# Patient Record
Sex: Female | Born: 1965 | Hispanic: No | Marital: Married | Smoking: Never smoker
Health system: Southern US, Community
[De-identification: ages and names within clinical notes are randomized; demographics above are authoritative.]

---

## 2002-08-20 ENCOUNTER — Ambulatory Visit (HOSPITAL_COMMUNITY): Admission: RE | Admit: 2002-08-20 | Discharge: 2002-08-20 | Payer: Self-pay | Admitting: Obstetrics and Gynecology

## 2002-08-20 ENCOUNTER — Encounter (INDEPENDENT_AMBULATORY_CARE_PROVIDER_SITE_OTHER): Payer: Self-pay | Admitting: Specialist

## 2003-06-18 ENCOUNTER — Inpatient Hospital Stay (HOSPITAL_COMMUNITY): Admission: AD | Admit: 2003-06-18 | Discharge: 2003-06-18 | Payer: Self-pay | Admitting: Obstetrics & Gynecology

## 2003-07-26 ENCOUNTER — Ambulatory Visit (HOSPITAL_COMMUNITY): Admission: RE | Admit: 2003-07-26 | Discharge: 2003-07-26 | Payer: Self-pay | Admitting: Family Medicine

## 2003-09-08 ENCOUNTER — Ambulatory Visit (HOSPITAL_COMMUNITY): Admission: RE | Admit: 2003-09-08 | Discharge: 2003-09-08 | Payer: Self-pay | Admitting: Obstetrics and Gynecology

## 2003-12-29 ENCOUNTER — Ambulatory Visit (HOSPITAL_COMMUNITY): Admission: RE | Admit: 2003-12-29 | Discharge: 2003-12-29 | Payer: Self-pay | Admitting: Family Medicine

## 2004-03-29 ENCOUNTER — Ambulatory Visit (HOSPITAL_COMMUNITY): Admission: RE | Admit: 2004-03-29 | Discharge: 2004-03-29 | Payer: Self-pay | Admitting: Family Medicine

## 2004-09-13 ENCOUNTER — Ambulatory Visit (HOSPITAL_COMMUNITY): Admission: RE | Admit: 2004-09-13 | Discharge: 2004-09-13 | Payer: Self-pay | Admitting: Obstetrics and Gynecology

## 2004-11-20 ENCOUNTER — Encounter: Admission: RE | Admit: 2004-11-20 | Discharge: 2005-02-18 | Payer: Self-pay | Admitting: Obstetrics and Gynecology

## 2005-01-18 ENCOUNTER — Inpatient Hospital Stay (HOSPITAL_COMMUNITY): Admission: AD | Admit: 2005-01-18 | Discharge: 2005-01-18 | Payer: Self-pay | Admitting: Obstetrics and Gynecology

## 2005-02-20 ENCOUNTER — Inpatient Hospital Stay (HOSPITAL_COMMUNITY): Admission: AD | Admit: 2005-02-20 | Discharge: 2005-02-22 | Payer: Self-pay | Admitting: Obstetrics and Gynecology

## 2005-02-23 ENCOUNTER — Encounter: Admission: RE | Admit: 2005-02-23 | Discharge: 2005-03-12 | Payer: Self-pay | Admitting: Obstetrics and Gynecology

## 2008-09-05 ENCOUNTER — Ambulatory Visit (HOSPITAL_COMMUNITY): Admission: RE | Admit: 2008-09-05 | Discharge: 2008-09-05 | Payer: Self-pay | Admitting: Obstetrics

## 2009-08-30 ENCOUNTER — Encounter: Admission: RE | Admit: 2009-08-30 | Discharge: 2009-08-30 | Payer: Self-pay | Admitting: Family Medicine

## 2011-03-29 NOTE — Op Note (Signed)
Janet Hudson, Janet Hudson             ACCOUNT NO.:  [id number]   MEDICAL RECORD NO.:  [id number]          PATIENT TYPE:  INP   LOCATION:  9143                          FACILITY:  [facility]   PHYSICIAN:  Gerri Spore B. Earlene Plater, M.D.  DATE OF BIRTH:  Jan 28, 1966   DATE OF PROCEDURE:  02/20/2005  DATE OF DISCHARGE:                                 OPERATIVE REPORT   PREOPERATIVE DIAGNOSIS:  1.  Term intrauterine pregnancy.  2.  Gestational diabetes well controlled.  3.  Repetitive severe variable decelerations.   POSTOPERATIVE DIAGNOSIS:  1.  Term intrauterine pregnancy.  2.  Gestational diabetes well controlled.  3.  Repetitive severe variable decelerations.   PROCEDURE:  Vacuum-assisted vaginal delivery from complete, complete, +3, OP  position.   ANESTHESIA:  Epidural.   FINDINGS:  A viable female, Apgars 9 and 9, cord pH 7.18.   COMPLICATIONS:  Mild shoulder dystocia relieved with McRobert's position and  suprapubic pressure x1.   INDICATIONS FOR PROCEDURE:  The patient presented with spontaneous rupture  of membranes at 39+ weeks, history of gestational diabetes, with estimated  fetal weight in the 3600 to 3900 gram range.  The patient was augmented with  Pitocin and progressed well to complete.  While pushing, she developed  repetitive severe variable decelerations that did not resolve with position  change, oxygen, or fluid bolus.  She was found to be complete, complete, +3  station, and direct OP.  Options were discussed including a primary cesarean  section versus vacuum-assisted delivery.  Risks of the vacuum discussed  including fetal scalp trauma, cephalohematoma, intracranial bleeding, and  discussed increased risk of maternal laceration.  Discussed the risk of  cesarean section including infection, bleeding, and damage to surrounding  organs.  The patient chose to try the vacuum.   DESCRIPTION OF PROCEDURE:  The bladder was emptied with in-and-out  catheterization.  Repeat  examination under anesthesia again showed complete,  complete, +3, OP position.  The vacuum was placed on the flexion point and  with two pulls descent occurred, but it was obvious that the OP position was  limiting the ability to delivery rapidly.  Therefore, the vertex was  manually rotated to OA and one additional pull used to deliver the vertex.  Nose and mouth were suctioned with a bulb.  Nuchal cord x1 noted.  Mild  shoulder dystocia encountered and relieved with McRobert's position and  suprapubic pressure x1.  Cord was clamped and cut.  The infant handed off to  the awaiting NICU team.  The baby was immediately vigorous and showed no  signs of injury.   The placenta was expelled spontaneously.  The uterus was explored.  There  were no retained contents.   The vagina was inspected and there was a fourth degree laceration and left  vaginal sulcus tear.   The rectal mucosa was closed with a running stitch of 3-0 chromic.  The  perirectal fascia was reapproximated with a running stitch of 2-0 chromic.  The lacerated ends of the rectal sphincter were grasped with an Allis clamp  and reapproximated with interrupted figure-of-eight sutures of 0  Vicryl.  The sulcus tear was closed with a running locked stitch of 2-0 chromic.  The  remainder of the vaginal laceration was closed with a running locked stitch  of 2-0 chromic.  The perineum was closed with a subcuticular 3-0 chromic.  Rectal examination showed complete closure, no evidence of fistula, and no  active bleeding.   The patient tolerated the procedure well and she and the newborn were in the  delivery room, both in satisfactory condition.      WBD/MEDQ  D:  02/20/2005  T:  02/21/2005  Job:  [id number]

## 2011-03-29 NOTE — Op Note (Signed)
NAME:  Janet Hudson, Janet Hudson                       ACCOUNT NO.:  [id number]   MEDICAL RECORD NO.:  [id number]                   PATIENT TYPE:  AMB   LOCATION:  SDC                                  [facility]ACILITY:  [facility]   PHYSICIAN:  Sherry A. Rosalio Macadamia, M.D.           DATE OF BIRTH:  1965-11-23   DATE OF PROCEDURE:  08/20/2002  DATE OF DISCHARGE:                                 OPERATIVE REPORT   PREOPERATIVE DIAGNOSES:  Tight hymenal band.   POSTOPERATIVE DIAGNOSES:  Tight hymenal band.   PROCEDURE:  1. Examination under anesthesia.  2. Pap smear.  3. Hymenotomy.   SURGEON:  Sherry A. Rosalio Macadamia, M.D.   ANESTHESIA:  General.   INDICATIONS:  This is a 45 year old G0, P0 woman who has been unable to have  any sexual coital activity in the three years that she has been married.  The patient has too significant discomfort when this has been attempted and  patient requests surgical treatment.  The patient was seen in the office  revealing a tight hymenal band that would only allow one finger to be  inserted.  Because of this patient is brought to the operating room for  examination under anesthesia, Pap smear, and hymenotomy.   FINDINGS:  Tight hymenal band.  Uterus anteflexed, normal size, mobile.  Normal cervix.  No adnexal mass.   PROCEDURE:  The patient was brought into the operating room.  Given adequate  general anesthesia.  She was placed in a dorsal lithotomy position.  Small  speculum was placed in the vagina.  Pap smear was obtained.  The patient was  then washed with Hibiclens externally and gently internally into the vagina.  The patient was draped in a sterile fashion.  Using double clamps of either  double hemostats or double Kelly clamps, the hymenal band was clamped, cut,  and sutured with 3-0 chromic at 10 o'clock, 7 o'clock, 5 o'clock, and 2  o'clock.  This was done in such a fashion to remove the hymenal band tissue.  It also had to be done in several different depths  in those positions to be  able to get enough extension of the vaginal tissues.  Small amount of  bleeding was present.  The small bleeders were stitched with 3-0 chromic in  a figure-of-eight stitch.  The incision was infiltrated with 0.5% Marcaine  with epinephrine throughout the area to encourage postoperative pain relief.  There are small bleeding sites from the needle holes.  This was stopped with  Monsel solution and Estrace cream was placed around the entire vaginal  introitus.  Adequate hemostasis was then present.  The patient was taken out  of the dorsal lithotomy position.  She was awakened.  She was moved from the  operating table to a stretcher in stable condition.    COMPLICATIONS:  None.   ESTIMATED BLOOD LOSS:  Less than 5 cc.  Sherry A. Rosalio Macadamia, M.D.    SAD/MEDQ  D:  08/20/2002  T:  08/20/2002  Job:  [id number]

## 2011-03-29 NOTE — H&P (Signed)
Janet Hudson, Janet Hudson             ACCOUNT NO.:  [id number]   MEDICAL RECORD NO.:  [id number]          PATIENT TYPE:  INP   LOCATION:  9173                          [facility]ACILITY:  [facility]   PHYSICIAN:  Richardean Sale, M.D.   DATE OF BIRTH:  1965-11-13   DATE OF ADMISSION:  02/20/2005  DATE OF DISCHARGE:                                HISTORY & PHYSICAL   ADMISSION DIAGNOSES:  A 39 week pregnancy with spontaneous rupture of  membranes and onset of labor.   HISTORY OF PRESENT ILLNESS:  This is a 45 year old, gravida 1, para 77 [place]  female at [redacted] weeks gestation with a due date of February 27, 2005 by ultrasound  who presented complaining of spontaneous rupture of membranes at 1:30 in the  morning with clear fluid. The patient noticed the onset of contractions  after she arrived to the hospital. She denies any vaginal bleeding, reports  good fetal movement. Prenatal care has been at [facility] with Dr.  Richardean Sale as the primary attending. The pregnancy has been complicated  by gestational diabetes which has been well controlled with diet. Patient  without any other complaints on admission.   PAST MEDICAL HISTORY:  No prior hospitalizations.   PAST SURGICAL HISTORY:  Hymenectomy in 2003.   OBSTETRIC HISTORY:  Gravida 1, para 0.   GYNECOLOGIC HISTORY:  Unexplained infertility. No history of abnormal Pap  smears or STD's.   FAMILY HISTORY:  Positive for hypertension and non-insulin dependent  diabetes, negative for any birth defects, congenital anomalies, spina  bifida, Down syndrome.   SOCIAL HISTORY:  She is married, denies tobacco, alcohol or drugs. 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   MEDICATIONS:  Prenatal vitamins.   ALLERGIES:  DARVOCET causes shortness of breath.   PRENATAL LABS:  Blood type is O positive, antibody screen negative, RPR  nonreactive, rubella immune, hepatitis B surface antigen nonreactive, group  B beta strap negative. HIV was declined. Three  hour Glucola abnormal  consistent with gestational diabetes. Blood sugars have been fasting is less  than 95 and 2 hour post prandial's less than 120. She has not required  insulin. The patient underwent first trimester screening with Dr. Particia Nearing for advanced maternal age which was within normal limits. She had an  AFP that was normal and she declined amniocentesis.   PHYSICAL EXAMINATION:  VITAL SIGNS:  She is afebrile, her vital signs are  stable. Height is 5 foot 0, weight 155 pounds.  GENERAL:  She is a well-developed, well-nourished, [place] female who is in  no acute distress but appears slightly nervous.  HEART:  Regular rate and rhythm.  LUNGS:  Clear to auscultation bilaterally.  ABDOMEN:  Gravid, soft, nontender. Fundal height is appropriate.  EXTREMITIES:  No cyanosis, clubbing or edema.  PELVIC:  Cervix is 2 to 3 cm, 70% effaced, -2 station, vertex. Fetal heart  rate tracing is reactive, contractions occuring irregularly.   Ultrasound performed on the date of admission shows a viable intrauterine  pregnancy in the vertex presentation. Estimated fetal weight is 3651 g to  3952 g based on  ultrasound today. Per the ultrasonographer, given the head  position the measurements of the head may not be entirely accurate and the  estimated fetal weight may be off up to 20 ounces per the ultrasonographer.  Position is occiput posterior, amniotic fluid index is 6 cm consistent with  ruptured membranes, placenta is posterior and grade 3.   ASSESSMENT:  A 45 year old, gravida 1, para 0 [place] female at [redacted] weeks  gestation by first trimester ultrasound with well controlled gestational  diabetes class A1 with spontaneous rupture of membranes and onset of labor.   PLAN:  1.  Ultrasound findings reviewed with the patient and her husband. Given      that the estimated fetal weight is less than 4000 g would not recommend      primary cesarean section at this time but recommend close  monitoring of      progress and labor with intrauterine pressure catheter and close      monitoring of Friedman curve. I reviewed with the patient that with her      gestational diabetes even though it is well controlled, there is a      slightly increased risk of shoulder dystocia which could cause      neurologic damage to the fetus, fracture the clavicle and there is an      increased risk of need for episiotomy and perineal trauma. Reviewed with      patient that the ultrasound may be inaccurate and the fetal weight may      be off by up to 20 ounces per the ultrasonographer. The patient      understands the need for close monitoring of progress in labor.  2.  Check CBC and hold clot on admission.  3.  Continuous fetal monitoring.  4.  Patient may have Stadol or epidural as needed for pain.  5.  All questions answered. The patient has voiced an understanding of above      plan.      JW/MEDQ  D:  02/20/2005  T:  02/20/2005  Job:  [id number]

## 2012-02-04 ENCOUNTER — Other Ambulatory Visit (HOSPITAL_COMMUNITY): Payer: Self-pay | Admitting: Obstetrics

## 2012-02-04 DIAGNOSIS — Z1231 Encounter for screening mammogram for malignant neoplasm of breast: Secondary | ICD-10-CM

## 2012-02-26 ENCOUNTER — Ambulatory Visit (HOSPITAL_COMMUNITY)
Admission: RE | Admit: 2012-02-26 | Discharge: 2012-02-26 | Disposition: A | Discharge status: Home / Self Care | Payer: BC Managed Care – PPO | Source: Ambulatory Visit | Attending: Obstetrics | Admitting: Obstetrics

## 2012-02-26 DIAGNOSIS — Z1231 Encounter for screening mammogram for malignant neoplasm of breast: Secondary | ICD-10-CM | POA: Insufficient documentation

## 2013-10-12 ENCOUNTER — Emergency Department (HOSPITAL_COMMUNITY)
Admission: EM | Admit: 2013-10-12 | Discharge: 2013-10-12 | Disposition: A | Discharge status: Home / Self Care | Payer: BC Managed Care – PPO | Source: Home / Self Care

## 2013-10-12 ENCOUNTER — Encounter (HOSPITAL_COMMUNITY): Payer: Self-pay | Admitting: Emergency Medicine

## 2013-10-12 DIAGNOSIS — R82998 Other abnormal findings in urine: Secondary | ICD-10-CM

## 2013-10-12 DIAGNOSIS — R1031 Right lower quadrant pain: Secondary | ICD-10-CM

## 2013-10-12 DIAGNOSIS — R319 Hematuria, unspecified: Secondary | ICD-10-CM

## 2013-10-12 LAB — POCT URINALYSIS DIP (DEVICE)
Bilirubin Urine: NEGATIVE
Glucose, UA: NEGATIVE mg/dL
Ketones, ur: NEGATIVE mg/dL
Nitrite: NEGATIVE
Protein, ur: NEGATIVE mg/dL
Specific Gravity, Urine: 1.025 (ref 1.005–1.030)
Urobilinogen, UA: 0.2 mg/dL (ref 0.0–1.0)
pH: 6.5 (ref 5.0–8.0)

## 2013-10-12 MED ORDER — TRAMADOL HCL 50 MG PO TABS: 50.0000 mg | ORAL_TABLET | Freq: Four times a day (QID) | ORAL | Status: DC | PRN

## 2013-10-12 MED ORDER — CEPHALEXIN 500 MG PO CAPS: 500.0000 mg | ORAL_CAPSULE | Freq: Four times a day (QID) | ORAL | Status: DC

## 2013-10-12 NOTE — ED Provider Notes (Signed)
CSN: [id number]     Arrival date & time 10/12/13  0941 History   First MD Initiated Contact with Patient 10/12/13 1019     Chief Complaint  Patient presents with  . Flank Pain   (Consider location/radiation/quality/duration/timing/severity/associated sxs/prior Treatment) HPI Comments: 47 year old mildly obese female states that last night she developed right low back pain radiates along the right lateral abdomen and to the right lower quadrant. The pain waxes and wanes, intermittent and often abates completely. Pain to be mild to severe at times. The pain is not affected by eating or movement. On rare occasion taking a deep breath will produce mild and brief pain. Denies fever, gastrointestinal symptoms. Vaginal discharge or pelvic pain. No urinary symptoms.   History reviewed. No pertinent past medical history. History reviewed. No pertinent past surgical history. History reviewed. No pertinent family history. History  Substance Use Topics  . Smoking status: Never Smoker   . Smokeless tobacco: Not on file  . Alcohol Use: No   OB History   Grav Para Term Preterm Abortions TAB SAB Ect Mult Living                 Review of Systems  Constitutional: Negative.   HENT: Negative.   Respiratory: Negative.   Cardiovascular: Negative.   Gastrointestinal: Positive for abdominal pain. Negative for nausea, vomiting, constipation and blood in stool.  Genitourinary: Negative for dysuria, urgency, frequency, flank pain, vaginal bleeding, vaginal discharge, enuresis, vaginal pain, menstrual problem and pelvic pain.  Musculoskeletal: Negative.   Neurological: Negative.     Allergies  Review of patient's allergies indicates no known allergies.  Home Medications   Current Outpatient Rx  Name  Route  Sig  Dispense  Refill  . cephALEXin (KEFLEX) 500 MG capsule   Oral   Take 1 capsule (500 mg total) by mouth 4 (four) times daily.   28 capsule   0   . traMADol (ULTRAM) 50 MG tablet   Oral    Take 1 tablet (50 mg total) by mouth every 6 (six) hours as needed.   15 tablet   0    BP 111/65  Pulse 76  Temp(Src) 98.6 F (37 C) (Oral)  Resp 16  SpO2 100% Physical Exam  Nursing note and vitals reviewed. Constitutional: She is oriented to person, place, and time. She appears well-developed and well-nourished. No distress.  Neck: Normal range of motion. Neck supple.  Cardiovascular: Normal rate, regular rhythm and normal heart sounds.   Pulmonary/Chest: Effort normal and breath sounds normal. No respiratory distress. She has no wheezes.  Abdominal: Soft. She exhibits no distension and no mass. There is no rebound and no guarding.  Minor, he critical tenderness in the right lower quadrant. The abdomen has been palpated several times. She states that "sometimes she thinks there may be some tenderness there but is unsure". There is no tenderness to the right upper quadrant, liver, gallbladder. No tenderness over the McBurney's point or right lower most quadrant. The tenderness that is somewhat equivocal his approximately 3 cm inferior to the umbilicus and 4 cm to the right. No overlying skin changes, no masses, or rebound.  Neurological: She is alert and oriented to person, place, and time.  Skin: Skin is warm and dry.  Psychiatric: She has a normal mood and affect.    ED Course  Procedures (including critical care time) Labs Review Labs Reviewed  POCT URINALYSIS DIP (DEVICE) - Abnormal; Notable for the following:    Hgb urine dipstick  TRACE (*)    Leukocytes, UA TRACE (*)    All other components within normal limits   Imaging Review No results found.  Results for orders placed during the hospital encounter of 10/12/13  POCT URINALYSIS DIP (DEVICE)      Result Value Range   Glucose, UA NEGATIVE  NEGATIVE mg/dL   Bilirubin Urine NEGATIVE  NEGATIVE   Ketones, ur NEGATIVE  NEGATIVE mg/dL   Specific Gravity, Urine 1.025  1.005 - 1.030   Hgb urine dipstick TRACE (*) NEGATIVE    pH 6.5  5.0 - 8.0   Protein, ur NEGATIVE  NEGATIVE mg/dL   Urobilinogen, UA 0.2  0.0 - 1.0 mg/dL   Nitrite NEGATIVE  NEGATIVE   Leukocytes, UA TRACE (*) NEGATIVE     MDM   1. Right lower quadrant abdominal pain   2. Hematuria   3. Urine leukocytes    Abdomen essentially benign. The patient has leukocytes and trace hemoglobin in the urine. She has no urinary symptoms. The most likely explanation is renal colic. Differential would include: Colonic Origin, appendicitis, abdominal gas pain. These are less likely. As above there is no right lower quadrant tenderness. The pain is not reproducible with palpation or movement. Due to the leukocytes in the urine as well has blood we will go ahead and treat with Keflex 500 mg 4 times a day for 7 days. She does have a PCP and call her for appointment today. From there she can have outpatient imaging studies as needed. She has been instructed to go to emergency department for worsening pain, fever, chills, nausea, vomiting or urinary symptoms. Tramadol 50 mg Q6 hours when necessary pain. #15    Hayden Rasmussen, NP 10/12/13 1135

## 2013-10-12 NOTE — ED Notes (Signed)
C/o right flank pain. On set yesterday.  Denies any other symptoms.

## 2013-10-13 NOTE — ED Provider Notes (Signed)
Medical screening examination/treatment/procedure(s) were performed by a resident physician or non-physician practitioner and as the supervising physician I was immediately available for consultation/collaboration.  Clementeen Graham, MD    Rodolph Bong, MD 10/13/13 0930

## 2014-02-03 ENCOUNTER — Telehealth: Payer: Self-pay | Admitting: *Deleted

## 2014-02-03 NOTE — Telephone Encounter (Signed)
Saw him last year for toenail fungus.  I been using the Formula 3.  I had stopped.  Is it still okay to use it now or do I need to come see him?  I left her a message that it okay to continue using the Formula 3.

## 2014-10-03 ENCOUNTER — Ambulatory Visit: Payer: Self-pay | Admitting: Podiatry

## 2014-11-16 ENCOUNTER — Ambulatory Visit: Payer: Self-pay

## 2014-11-16 ENCOUNTER — Ambulatory Visit (INDEPENDENT_AMBULATORY_CARE_PROVIDER_SITE_OTHER): Payer: BC Managed Care – PPO | Admitting: Podiatry

## 2014-11-16 ENCOUNTER — Encounter: Payer: Self-pay | Admitting: Podiatry

## 2014-11-16 VITALS — BP 110/69 | HR 74 | Resp 16

## 2014-11-16 DIAGNOSIS — M79671 Pain in right foot: Secondary | ICD-10-CM

## 2014-11-16 DIAGNOSIS — B351 Tinea unguium: Secondary | ICD-10-CM

## 2014-11-16 MED ORDER — FLUCONAZOLE 150 MG PO TABS: ORAL_TABLET | ORAL | Status: DC

## 2014-11-16 NOTE — Patient Instructions (Signed)

## 2014-11-16 NOTE — Progress Notes (Signed)
Subjective:     Patient ID: Janet Hudson, female   DOB: 1966-09-03, 49 y.o.   MRN: [id number]  HPI patient presents with a yellow hallux nail right that she's been working on over the last year and a half and it seems to be getting slowly increased as far as the amount of coloration that she is seen   Review of Systems     Objective:   Physical Exam Neurovascular status intact with no health history changes and did have a history of taking Lamisil which may have caused her to have some blood in her urine but no liver function changes. Right hallux nail distal three quarters is yellow and thick    Assessment:     Probable combination of trauma and mycotic nail infection right hallux    Plan:     Reviewed condition at great length and at this time we're in a place her on Diflucan one pill for 6 weeks one pill a week. I then went ahead and discussed laser and she wants to have laser therapy understanding is no guarantee this will solve her problem but she is willing to try it and patient is scheduled for laser to be commenced in the next week

## 2014-12-08 ENCOUNTER — Ambulatory Visit: Payer: BC Managed Care – PPO | Admitting: Podiatry

## 2014-12-15 ENCOUNTER — Ambulatory Visit: Payer: BC Managed Care – PPO | Admitting: Podiatry

## 2014-12-19 ENCOUNTER — Ambulatory Visit: Payer: BC Managed Care – PPO | Admitting: Podiatry

## 2015-01-02 ENCOUNTER — Other Ambulatory Visit: Payer: Self-pay | Admitting: Podiatry

## 2015-01-02 NOTE — Telephone Encounter (Signed)
Pt asked for refill of Diflucan.  I reviewed pt's medication list and she has completed Diflucan 1 tablet each week for 6 weeks.  Pt states she is seeing improvement.  I told her it could take 6 to 9 months for total grow out.  Pt agreed.

## 2015-01-02 NOTE — Telephone Encounter (Signed)
Pt called and states she called last week about questions about a refill. She would like someone to call her back.

## 2015-01-04 ENCOUNTER — Other Ambulatory Visit: Payer: Self-pay | Admitting: *Deleted

## 2015-01-04 MED ORDER — FLUCONAZOLE 150 MG PO TABS: ORAL_TABLET | ORAL | Status: AC

## 2015-01-04 NOTE — Telephone Encounter (Signed)
[facility] sent a refill request for Fluconazole 150mg  tablets.  Dr Everlena Cooperegal okayed the refill but no additional refills.

## 2020-07-31 ENCOUNTER — Other Ambulatory Visit: Payer: Self-pay | Admitting: Gastroenterology

## 2020-07-31 DIAGNOSIS — R198 Other specified symptoms and signs involving the digestive system and abdomen: Secondary | ICD-10-CM

## 2020-08-11 ENCOUNTER — Other Ambulatory Visit: Payer: Self-pay

## 2020-08-11 ENCOUNTER — Ambulatory Visit
Admission: RE | Admit: 2020-08-11 | Discharge: 2020-08-11 | Disposition: A | Discharge status: Home / Self Care | Payer: BC Managed Care – PPO | Source: Ambulatory Visit | Attending: Gastroenterology | Admitting: Gastroenterology

## 2020-08-11 DIAGNOSIS — R198 Other specified symptoms and signs involving the digestive system and abdomen: Secondary | ICD-10-CM

## 2020-08-11 MED ORDER — IOPAMIDOL (ISOVUE-300) INJECTION 61%
100.0000 mL | Freq: Once | INTRAVENOUS | Status: AC | PRN
  Administered 2020-08-11: 100 mL via INTRAVENOUS

## 2021-06-12 IMAGING — CT CT ABD-PELV W/ CM
1 of 3 series · 13 of 32 positions shown, 18 images · IV contrast (APPLIED)
Comparison: None.
COMPARISON: None.

Addendum:
CLINICAL DATA: Rectal pain and pressure for a couple of months ,
painful bowel movements Cancer-none Surgery-none Prev in pacs
08-30-2009 Hx of left ovarian cyst Nonsmoker ^100mL G8D4OV-844
IOPAMIDOL (G8D4OV-844) INJECTION 61%

EXAM:
CT ABDOMEN AND PELVIS WITH CONTRAST
TECHNIQUE: Multidetector CT imaging of the abdomen and pelvis was performed
using the standard protocol following bolus administration of
intravenous contrast.
CONTRAST:  100mL G8D4OV-844 IOPAMIDOL (G8D4OV-844) INJECTION 61%

[Series 2: abd/pelvis w/cm · axial · 0.70mm/px · z∈[-478,-34]mm · 13 of 103 slices shown, 18 images]
[im 7/103  soft-tissue]
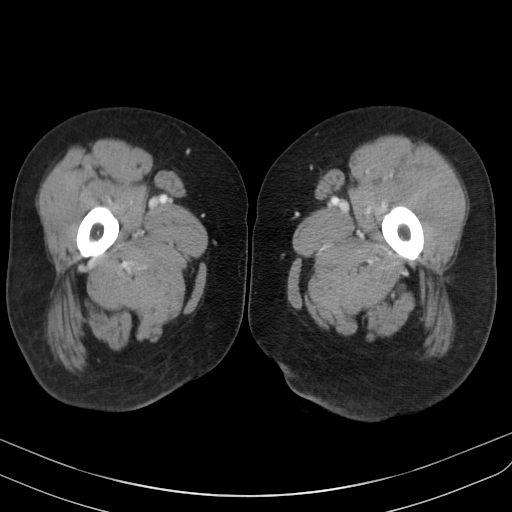
[im 7/103  bone]
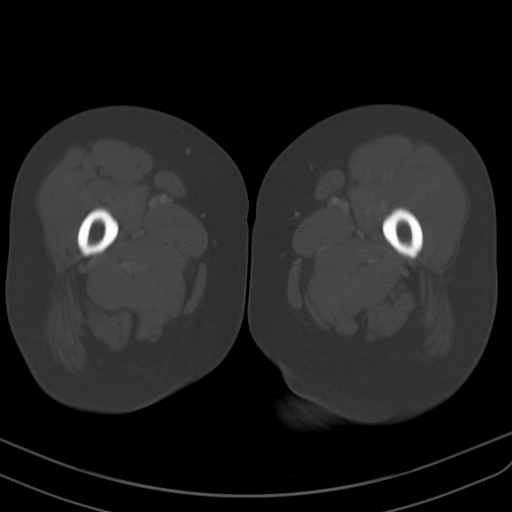
[im 13/103  soft-tissue]
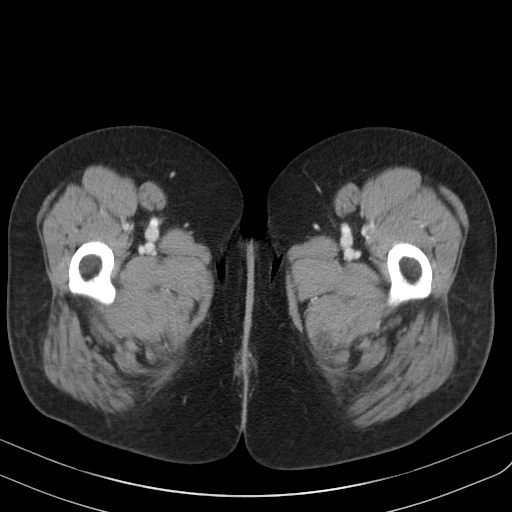
[im 26/103  soft-tissue]
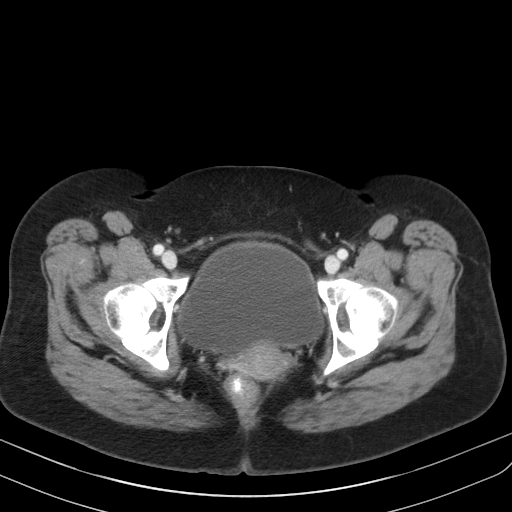
[im 32/103  soft-tissue]
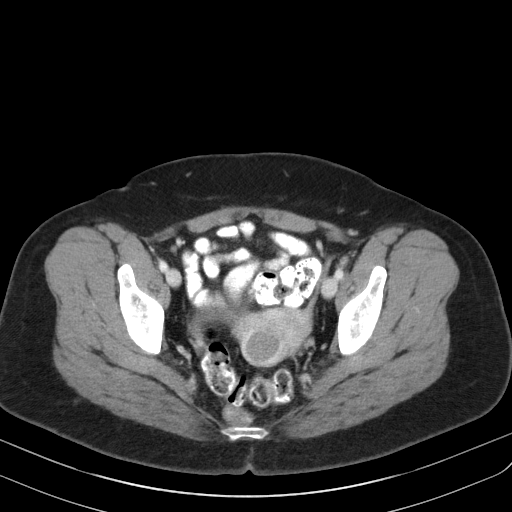
[im 39/103  soft-tissue]
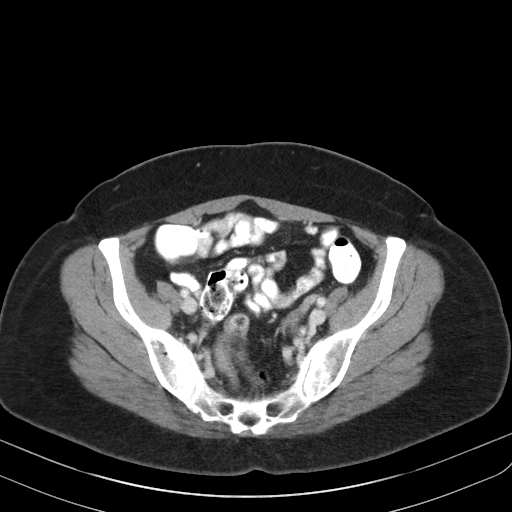
[im 45/103  soft-tissue]
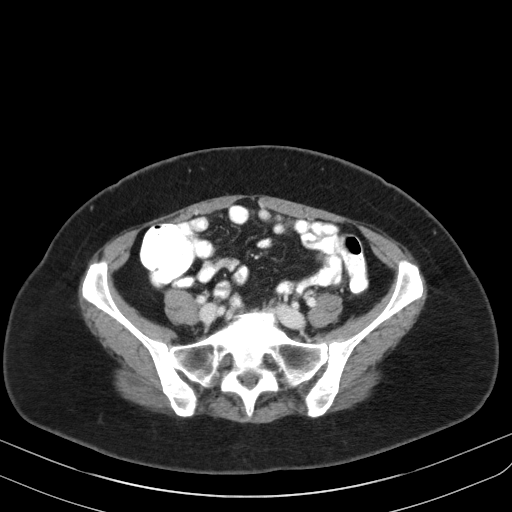
[im 58/103  soft-tissue]
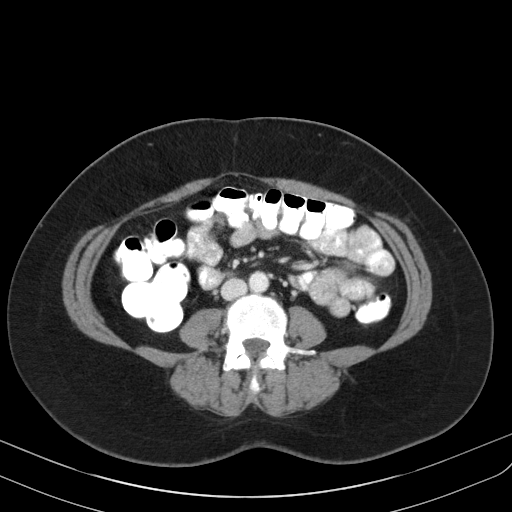
[im 64/103  soft-tissue]
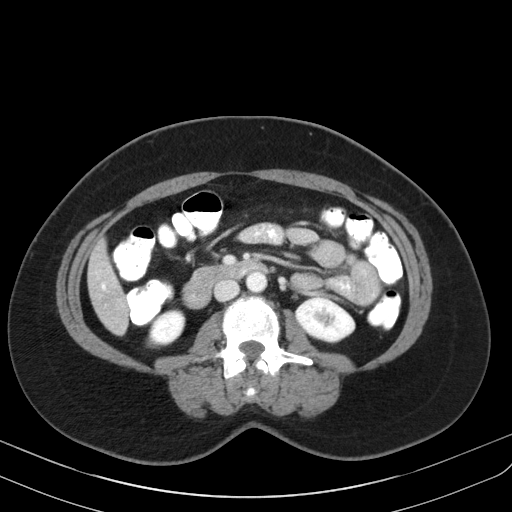
[im 71/103  soft-tissue]
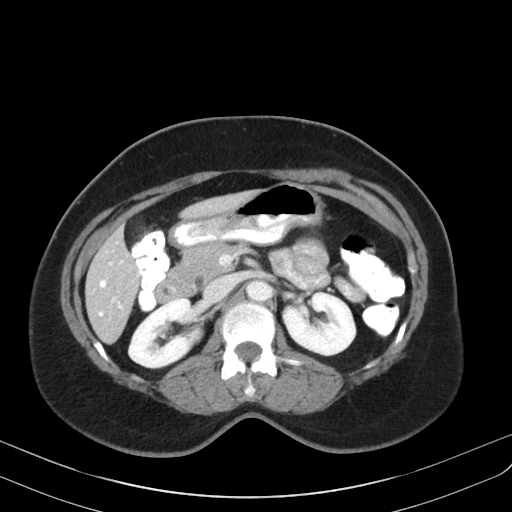
[im 71/103  bone]
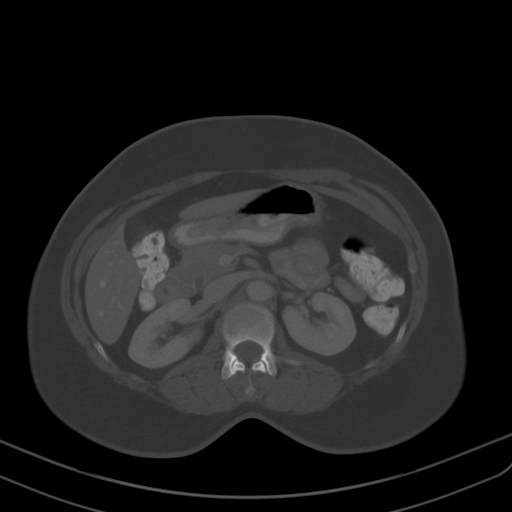
[im 77/103  soft-tissue]
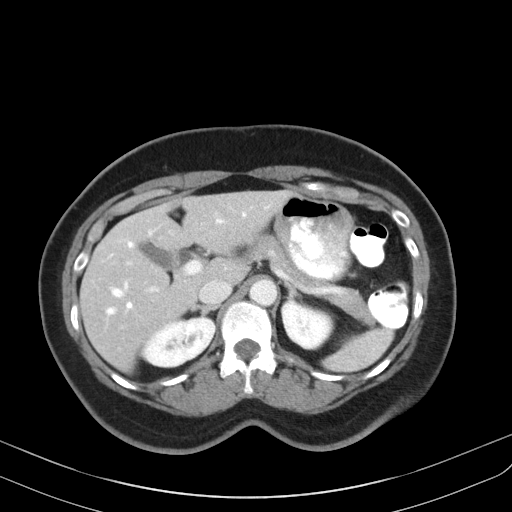
[im 77/103  lung]
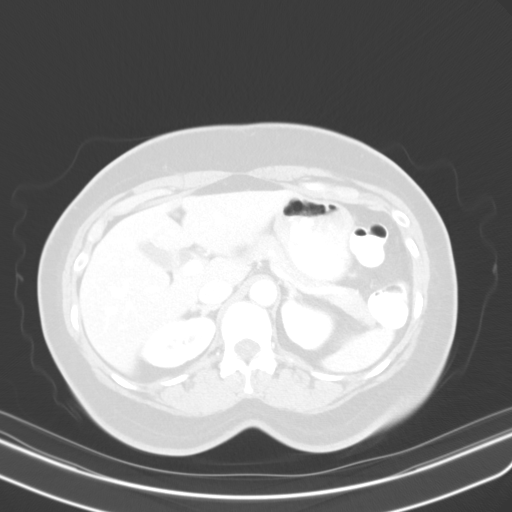
[im 83/103  lung]
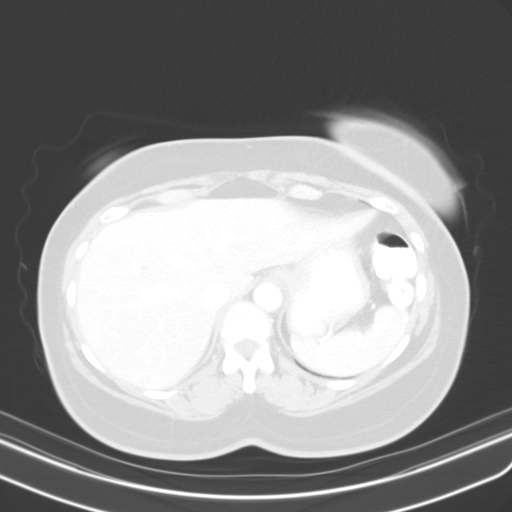
[im 90/103  soft-tissue]
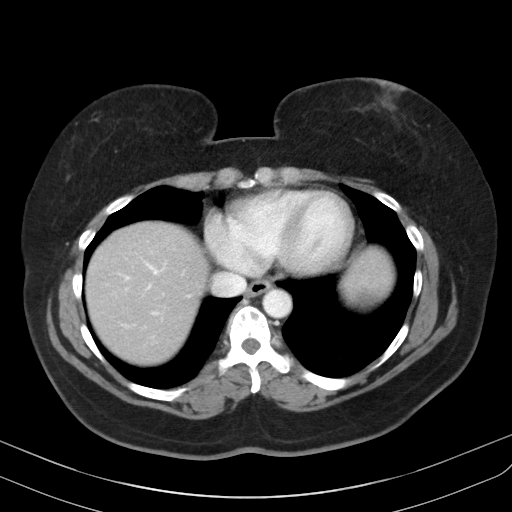
[im 90/103  lung]
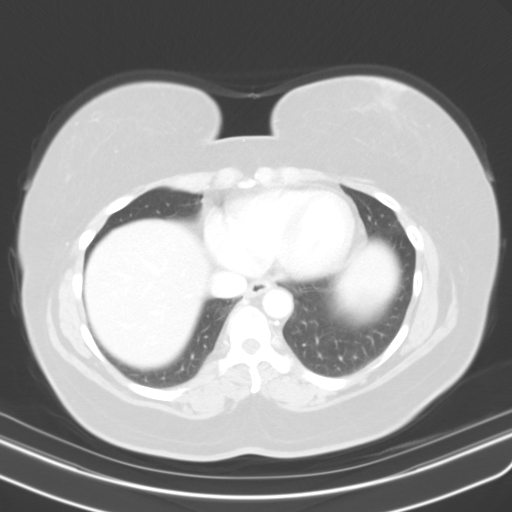
[im 96/103  soft-tissue]
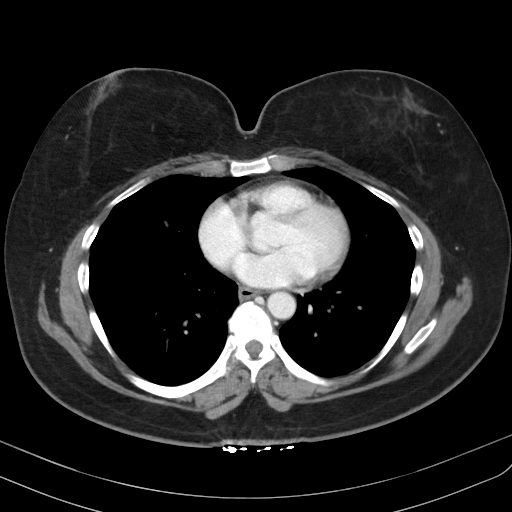
[im 96/103  lung]
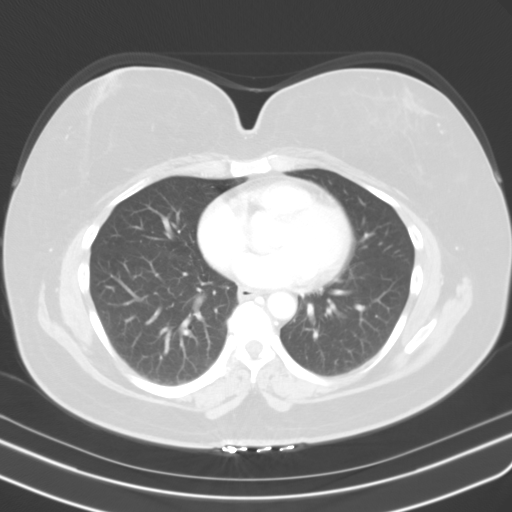

[13 of 32 positions shown; findings below may reference images not displayed]

FINDINGS: Lower chest: No acute abnormality.

Hepatobiliary: No focal liver abnormality is seen. No gallstones,
gallbladder wall thickening, or biliary dilatation.

Pancreas: Unremarkable. No pancreatic ductal dilatation or
surrounding inflammatory changes.

Spleen: Normal in size without focal abnormality.

Adrenals/Urinary Tract: Adrenal glands are unremarkable. Kidneys are
normal, without renal calculi, focal lesion, or hydronephrosis.
Bladder is unremarkable.

Stomach/Bowel: Stomach is within normal limits. No evidence of bowel
wall thickening, distention, or inflammatory changes.

Vascular/Lymphatic: No significant vascular findings are present. No
enlarged abdominal or pelvic lymph nodes.

Reproductive: Multiple masses in the uterus, likely fibroids.
Bilateral adnexa are unremarkable.

Other: No abdominal wall hernia or abnormality. No abdominopelvic
ascites.

Musculoskeletal: Severe degenerative disc disease at L5-S1.
IMPRESSION: 1. No acute findings in the abdomen or pelvis. No CT finding to
explain the patient's rectal symptoms.
2. Multiple masses in the uterus, likely fibroids.
3. Severe degenerative disc disease at L5-S1.

ADDENDUM:
There are at least two uterine fibroids 2.2 x 2.1 cm in the
posterior uterus and a somewhat ill-defined fibroid measuring
approximately 2.5 x 2.3 cm in the superior uterus.

Pelvis ultrasound may be considered for full characterization of the
uterine fibroids.

*** End of Addendum ***
FINDINGS: Lower chest: No acute abnormality.

Hepatobiliary: No focal liver abnormality is seen. No gallstones,
gallbladder wall thickening, or biliary dilatation.

Pancreas: Unremarkable. No pancreatic ductal dilatation or
surrounding inflammatory changes.

Spleen: Normal in size without focal abnormality.

Adrenals/Urinary Tract: Adrenal glands are unremarkable. Kidneys are
normal, without renal calculi, focal lesion, or hydronephrosis.
Bladder is unremarkable.

Stomach/Bowel: Stomach is within normal limits. No evidence of bowel
wall thickening, distention, or inflammatory changes.

Vascular/Lymphatic: No significant vascular findings are present. No
enlarged abdominal or pelvic lymph nodes.

Reproductive: Multiple masses in the uterus, likely fibroids.
Bilateral adnexa are unremarkable.

Other: No abdominal wall hernia or abnormality. No abdominopelvic
ascites.

Musculoskeletal: Severe degenerative disc disease at L5-S1.
IMPRESSION: 1. No acute findings in the abdomen or pelvis. No CT finding to
explain the patient's rectal symptoms.
2. Multiple masses in the uterus, likely fibroids.
3. Severe degenerative disc disease at L5-S1.
